# Patient Record
Sex: Male | Born: 1935 | Race: White | Hispanic: No | Marital: Married
Health system: Southern US, Community
[De-identification: ages and names within clinical notes are randomized; demographics above are authoritative.]

## PROBLEM LIST (undated history)

## (undated) DIAGNOSIS — I10 Essential (primary) hypertension: Secondary | ICD-10-CM

---

## 2016-06-02 ENCOUNTER — Emergency Department (HOSPITAL_COMMUNITY): Payer: Medicare Other

## 2016-06-02 ENCOUNTER — Encounter (HOSPITAL_COMMUNITY): Payer: Self-pay

## 2016-06-02 ENCOUNTER — Emergency Department (HOSPITAL_COMMUNITY)
Admission: EM | Admit: 2016-06-02 | Discharge: 2016-06-02 | Disposition: A | Discharge status: Home / Self Care | Payer: Medicare Other | Attending: Emergency Medicine | Admitting: Emergency Medicine

## 2016-06-02 DIAGNOSIS — R52 Pain, unspecified: Secondary | ICD-10-CM

## 2016-06-02 DIAGNOSIS — M65332 Trigger finger, left middle finger: Secondary | ICD-10-CM | POA: Insufficient documentation

## 2016-06-02 DIAGNOSIS — I10 Essential (primary) hypertension: Secondary | ICD-10-CM | POA: Insufficient documentation

## 2016-06-02 DIAGNOSIS — M79642 Pain in left hand: Secondary | ICD-10-CM | POA: Diagnosis present

## 2016-06-02 HISTORY — DX: Essential (primary) hypertension: I10

## 2016-06-02 NOTE — ED Provider Notes (Signed)
[facility]-EMERGENCY DEPT Provider Note   CSN: [id number] Arrival date & time: 06/02/16  1006     History   Chief Complaint Chief Complaint  Patient presents with  . Hand Pain    HPI Antonio Campbell is a 82 y.o. 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 He states it feels the same as when he had trigger finger on his right hand. He has noticed that it would lock up intermittently but he was still able to straighten it out until this morning when he got stuck in flexion. He describes his pain as 10/10 intermittent with movement, sharp nonradiating left middle finger pain worse with movement and unimproved with warmth. He has not tried anything else for his symptoms. He has not been able to "unlock" his finger from a flexed position. His last surgery for trigger finger on the right side was in 2007 by Dr. Allene Dillon in [place]. He denies warmth, erythema, bruising, swelling, numbness, tingling, focal weakness, or any other complaints at this time. Denies recent injury.   The history is provided by the patient and medical records. No language interpreter was used.  Hand Pain  This is a recurrent problem. The current episode started more than 1 week ago. Episode frequency: intermittent. The problem has not changed since onset.Exacerbated by: movement. Nothing relieves the symptoms. He has tried a warm compress for the symptoms. The treatment provided no relief.    Past Medical History:  Diagnosis Date  . Hypertension     There are no active problems to display for this patient.   No past surgical history on file.     Home Medications    Prior to Admission medications   Not on File    Family History No family history on file.  Social History Social History  Substance Use Topics  . Smoking status: Not on file  .  Smokeless tobacco: Not on file  . Alcohol use Not on file     Allergies   Bystolic [nebivolol hcl]; Cardizem [diltiazem]; Cephalosporins; Cyclobenzaprine; Cymbalta [duloxetine hcl]; Dilantin [phenytoin sodium extended]; Neurontin [gabapentin]; Penicillins; and Sulfa antibiotics   Review of Systems Review of Systems  Musculoskeletal: Positive for arthralgias. Negative for joint swelling.  Skin: Negative for color change and wound.  Allergic/Immunologic: Negative for immunocompromised state.  Neurological: Negative for weakness and numbness.  Psychiatric/Behavioral: Negative for confusion.   10 Systems reviewed and are negative for acute change except as noted in the HPI.   Physical Exam Updated Vital Signs BP 152/85 (BP Location: Left Arm)   Pulse 75   Temp 98.6 F (37 C) (Oral)   Resp 17   Ht 5\' 9"  (1.753 m)   Wt 88.5 kg   SpO2 96%   BMI 28.80 kg/m   Physical Exam  Constitutional: He is oriented to person, place, and time. Vital signs are normal. He appears well-developed and well-nourished.  Non-toxic appearance. No distress.  Afebrile, nontoxic, NAD  HENT:  Head: Normocephalic and atraumatic.  Mouth/Throat: Mucous membranes are normal.  Eyes: Conjunctivae and EOM are normal. Right eye exhibits no discharge. Left eye exhibits no discharge.  Neck: Normal range of motion. Neck supple.  Cardiovascular: Normal rate and intact distal pulses.   Pulmonary/Chest: Effort normal. No respiratory distress.  Abdominal: Normal appearance. He exhibits no distension.  Musculoskeletal:  Left hand: He exhibits decreased range of motion (initially of middle finger) and tenderness. He exhibits no bony tenderness, normal two-point discrimination, normal capillary refill, no deformity, no laceration and no swelling. Normal sensation noted. Normal strength noted.       Hands: L middle finger initially locked in flexion at DIP and PIP joints, with mild TTP over palmar aspect of 3rd MCP  joint area, but after gentle massage of this area, middle finger was able to be extended fully and audible snap heard and palpated at the MCP area; after finger was fully extended, pt able to demonstrate FROM in all joints; wiggles all fingers with ease. No focal bony TTP. No crepitus or deformity, no swelling, no erythema or bruising, no warmth. Strength and sensation grossly intact, distal pulses intact, compartments soft. L wrist unremarkable.   Neurological: He is alert and oriented to person, place, and time. He has normal strength. No sensory deficit.  Skin: Skin is warm, dry and intact. No rash noted.  Psychiatric: He has a normal mood and affect.  Nursing note and vitals reviewed.    ED Treatments / Results  Labs (all labs ordered are listed, but only abnormal results are displayed) Labs Reviewed - No data to display  EKG  EKG Interpretation None       Radiology Dg Finger Middle Left  Result Date: 06/02/2016 CLINICAL DATA:  Flexion deformity of the third digit. EXAM: LEFT MIDDLE FINGER 2+V COMPARISON:  None. FINDINGS: Flexion at the PIP and DIP joints of the long finger. The joint spaces are fairly well maintained for age. Mild osteoarthritic type degenerative changes involving the DIP joints. No fracture or dislocation is identified. IMPRESSION: Flexion at the DIP and PIP joints of the third finger but no fracture or dislocation. Electronically Signed   By: Rudie MeyerP.  Gallerani M.D.   On: 06/02/2016 11:38    Procedures Procedures (including critical care time)  Medications Ordered in ED Medications - No data to display   Initial Impression / Assessment and Plan / ED Course  I have reviewed the triage vital signs and the nursing notes.  Pertinent labs & imaging results that were available during my care of the patient were reviewed by me and considered in my medical decision making (see chart for details).     82 y.o. male here with trigger finger of L middle finger. Finger  initially locked in place in flexion, but after massage of flexor tendons, I was able to full extend the finger again; splinted into place. Discussed NSAIDs for antiinflammatory properties. Heat/ice/tylenol use as well. F/up with his orthopedist in 1-2wks for ongoing management of his trigger finger. Of note, xray obtained in triage which was unremarkable aside from demonstrating his flexion at DIP and PIP joints. I explained the diagnosis and have given explicit precautions to return to the ER including for any other new or worsening symptoms. The patient understands and accepts the medical plan as it's been dictated and I have answered their questions. Discharge instructions concerning home care and prescriptions have been given. The patient is STABLE and is discharged to home in good condition.   Final Clinical Impressions(s) / ED Diagnoses   Final diagnoses:  Trigger middle finger of left hand    New Prescriptions New Prescriptions   No medications on file     [id number] LaneMercedes Ruthanne Mcneish, PA-C 06/02/16 1200    Cathren LaineKevin Steinl, MD 06/02/16 [id number]

## 2016-06-02 NOTE — ED Triage Notes (Signed)
Patient here with left hand middle finger pain x 2 weeks, has had limited ROM to finger and this am unable to extend finger without severe pain. No known injury, NAD

## 2016-06-02 NOTE — Discharge Instructions (Signed)
You symptoms are due to trigger finger. Use motrin/aleve/NSAIDs to help with inflammation and pain, using additional tylenol for additional pain relief. Use ice or heat to the area to help with pain and inflammation. Massage the area to help with it getting locked in place. Use the splint to help prevent the finger from getting locked in place again. Follow up with your orthopedist in 1-2 weeks for ongoing management of your trigger finger. Return to the ER for changes or worsening symptoms.

## 2016-06-09 ENCOUNTER — Encounter (HOSPITAL_COMMUNITY): Payer: Self-pay | Admitting: Emergency Medicine

## 2016-06-09 ENCOUNTER — Ambulatory Visit (HOSPITAL_COMMUNITY)
Admission: EM | Admit: 2016-06-09 | Discharge: 2016-06-09 | Disposition: A | Discharge status: Home / Self Care | Payer: Medicare Other | Attending: Internal Medicine | Admitting: Internal Medicine

## 2016-06-09 DIAGNOSIS — J111 Influenza due to unidentified influenza virus with other respiratory manifestations: Secondary | ICD-10-CM

## 2016-06-09 DIAGNOSIS — R69 Illness, unspecified: Secondary | ICD-10-CM

## 2016-06-09 MED ORDER — GUAIFENESIN-CODEINE 100-10 MG/5ML PO SYRP: 5.0000 mL | ORAL_SOLUTION | ORAL | 0 refills | Status: AC | PRN

## 2016-06-09 MED ORDER — OSELTAMIVIR PHOSPHATE 75 MG PO CAPS: 75.0000 mg | ORAL_CAPSULE | Freq: Two times a day (BID) | ORAL | 0 refills | Status: AC

## 2016-06-09 NOTE — ED Triage Notes (Signed)
The patient presented to the [facility] with a complaint of a cough and body aches x 3 days. 

## 2016-06-09 NOTE — Discharge Instructions (Signed)
Likely all symptoms considered you have flulike symptoms if not influenza itself. We will go ahead and treat with Tamiflu. The following medications are listed to help with specific symptoms associated with upper respiratory infection and flulike symptoms. The medicine to help with drainage would be antihistamines. He will see that listed below. I do not think any of these medicines are on your allergy list however recheck if needed. Be sure to drink plenty fluids and stay well-hydrated, cool liquids are best for sore throat. Cepacol lozenges for sore throat pain. Ibuprofen or Tylenol for aches and pains and fever. Sudafed PE 10 mg every 4 to 6 hours as needed for congestion Allegra or Zyrtec daily as needed for drainage and runny nose. For stronger antihistamine may take Chlor-Trimeton 2 to 4 mg every 4 to 6 hours, may cause drowsiness. Saline nasal spray used frequently. Ibuprofen 600 mg every 6 hours as needed for pain, discomfort or fever. Drink plenty of fluids and stay well-hydrated.

## 2016-06-09 NOTE — ED Provider Notes (Signed)
CSN: [id number]     Arrival date & time 06/09/16  1609 History   First MD Initiated Contact with Patient 06/09/16 1911     Chief Complaint  Patient presents with  . Cough   (Consider location/radiation/quality/duration/timing/severity/associated sxs/prior Treatment) 82 year old male presents to the urgent care with a several upper respiratory symptoms including cough, runny nose, PND, body aches and pains, sore throat, fever and headache. He states his temperature was 100.1 recently and on arrival to the urgent care 9.5. He states he had a flu shot this year. And states that he feels like he probably has the flu now. The symptoms began rather suddenly yesterday.      Past Medical History:  Diagnosis Date  . Hypertension    History reviewed. No pertinent surgical history. History reviewed. No pertinent family history. Social History  Substance Use Topics  . Smoking status: Not on file  . Smokeless tobacco: Not on file  . Alcohol use Not on file    Review of Systems  Constitutional: Positive for activity change, fatigue and fever. Negative for diaphoresis.  HENT: Positive for congestion, postnasal drip, rhinorrhea and sore throat. Negative for ear pain, facial swelling and trouble swallowing.   Eyes: Negative for pain, discharge and redness.  Respiratory: Positive for cough. Negative for chest tightness and shortness of breath.   Cardiovascular: Negative.   Gastrointestinal: Negative.   Musculoskeletal: Positive for myalgias. Negative for neck pain and neck stiffness.  Neurological: Negative.   All other systems reviewed and are negative.   Allergies  Bystolic [nebivolol hcl]; Cardizem [diltiazem]; Cephalosporins; Cyclobenzaprine; Cymbalta [duloxetine hcl]; Dilantin [phenytoin sodium extended]; Neurontin [gabapentin]; Penicillins; and Sulfa antibiotics  Home Medications   Prior to Admission medications   Medication Sig Start Date End Date Taking? Authorizing Provider   aspirin EC 81 MG tablet Take 81 mg by mouth daily.    Historical Provider, MD  citalopram (CELEXA) 10 MG tablet Take 10 mg by mouth 2 (two) times daily.    Historical Provider, MD  hydrALAZINE (APRESOLINE) 50 MG tablet Take 50 mg by mouth 2 (two) times daily. 03/30/16   Historical Provider, MD  hydrocortisone 2.5 % cream Apply 1 application topically 2 (two) times daily.    Historical Provider, MD  lisinopril (PRINIVIL,ZESTRIL) 5 MG tablet Take 5 mg by mouth 2 (two) times daily.    Historical Provider, MD  magnesium oxide (MAG-OX) 400 MG tablet Take 400 mg by mouth 2 (two) times daily.    Historical Provider, MD  meloxicam (MOBIC) 15 MG tablet Take 15 mg by mouth daily.    Historical Provider, MD  Multiple Vitamins-Minerals (MULTIVITAMIN PO) Take 1 tablet by mouth daily.    Historical Provider, MD  omeprazole (PRILOSEC) 20 MG capsule Take 20 mg by mouth daily.    Historical Provider, MD  oseltamivir (TAMIFLU) 75 MG capsule Take 1 capsule (75 mg total) by mouth 2 (two) times daily. X 5 days 06/09/16   Hayden Rasmussen, NP  Potassium Gluconate 595 MG CAPS Take 595 mg by mouth 2 (two) times daily.    Historical Provider, MD  zolpidem (AMBIEN) 5 MG tablet Take 5 mg by mouth at bedtime.    Historical Provider, MD   Meds Ordered and Administered this Visit  Medications - No data to display  BP 146/57 (BP Location: Right Arm)   Pulse 85   Temp 99.5 F (37.5 C) (Oral)   Resp 20   SpO2 98%  No data found.   Physical Exam  Constitutional:  He is oriented to person, place, and time. He appears well-developed and well-nourished. No distress.  HENT:  Head: Normocephalic and atraumatic.  Right Ear: External ear normal.  Left Ear: External ear normal.  Bilateral TMs are normal. Oropharynx with minor erythema and clear PND. No exudates  Eyes: EOM are normal.  Neck: Normal range of motion. Neck supple. No JVD present.  Cardiovascular: Normal rate, regular rhythm, normal heart sounds and intact distal  pulses.   Pulmonary/Chest: Effort normal and breath sounds normal. No respiratory distress. He has no wheezes. He has no rales.  Lungs perfectly clear. No adventitious sounds. Good air movement chest expansion.  Abdominal: Soft. There is no tenderness.  Musculoskeletal: Normal range of motion. He exhibits no edema.  Neurological: He is alert and oriented to person, place, and time.  Skin: Skin is warm and dry.  Psychiatric: He has a normal mood and affect.  Nursing note and vitals reviewed.   Urgent Care Course     Procedures (including critical care time)  Labs Review Labs Reviewed - No data to display  Imaging Review No results found.   Visual Acuity Review  Right Eye Distance:   Left Eye Distance:   Bilateral Distance:    Right Eye Near:   Left Eye Near:    Bilateral Near:         MDM   1. Influenza-like illness    Likely all symptoms considered you have flulike symptoms if not influenza itself. We will go ahead and treat with Tamiflu. The following medications are listed to help with specific symptoms associated with upper respiratory infection and flulike symptoms. The medicine to help with drainage would be antihistamines. He will see that listed below. I do not think any of these medicines are on your allergy list however recheck if needed. Be sure to drink plenty fluids and stay well-hydrated, cool liquids are best for sore throat. Cepacol lozenges for sore throat pain. Ibuprofen or Tylenol for aches and pains and fever. Sudafed PE 10 mg every 4 to 6 hours as needed for congestion Allegra or Zyrtec daily as needed for drainage and runny nose. For stronger antihistamine may take Chlor-Trimeton 2 to 4 mg every 4 to 6 hours, may cause drowsiness. Saline nasal spray used frequently. Ibuprofen 600 mg every 6 hours as needed for pain, discomfort or fever. Drink plenty of fluids and stay well-hydrated. Meds ordered this encounter  Medications  . oseltamivir  (TAMIFLU) 75 MG capsule    Sig: Take 1 capsule (75 mg total) by mouth 2 (two) times daily. X 5 days    Dispense:  10 capsule    Refill:  0    Order Specific Question:   Supervising Provider    Answer:   Eustace MooreMURRAY, LAURA W [[id number]]   Cheratussin AC 120 ml as dir.    Hayden Rasmussenavid Shiann Kam, NP 06/09/16 1932    Hayden Rasmussenavid Carmaleta Youngers, NP 06/09/16 1958

## 2017-11-08 IMAGING — DX DG FINGER MIDDLE 2+V*L*
4 series · 4 of 4 positions shown · non-contrast
Comparison: None.

CLINICAL DATA: Flexion deformity of the third digit.

EXAM:
LEFT MIDDLE FINGER 2+V

[finger ap]
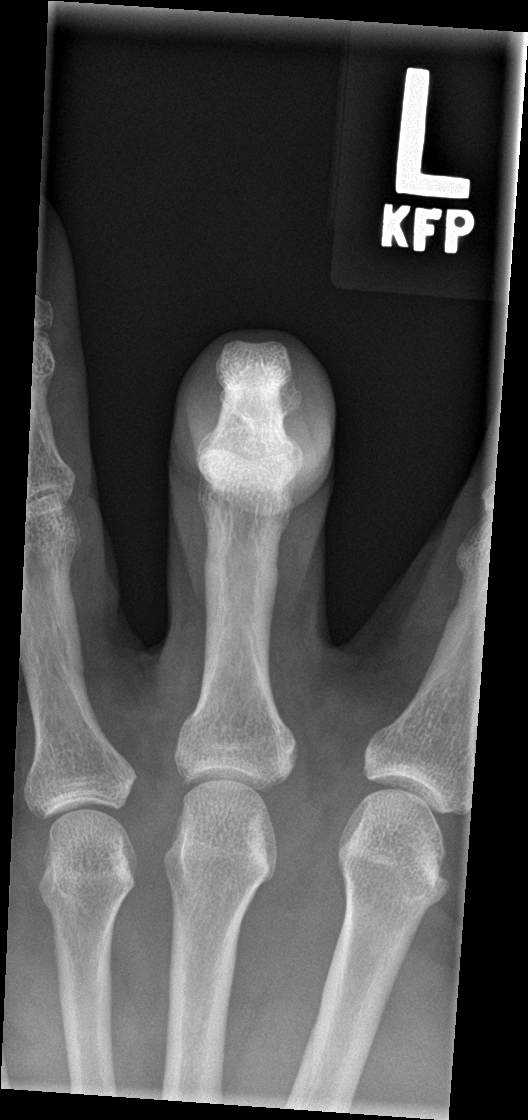

[finger obl]
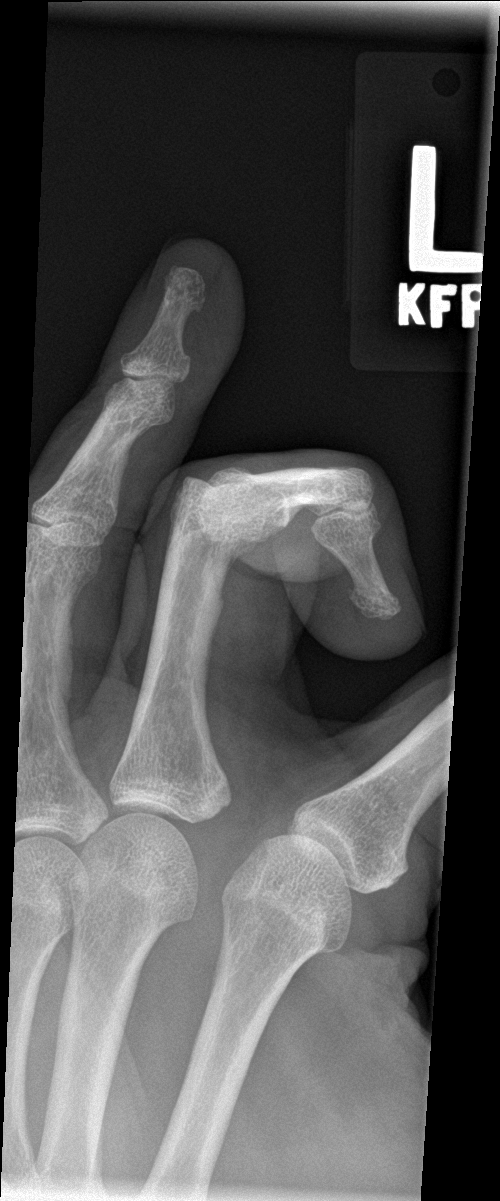

[finger lat (1 of 2)]
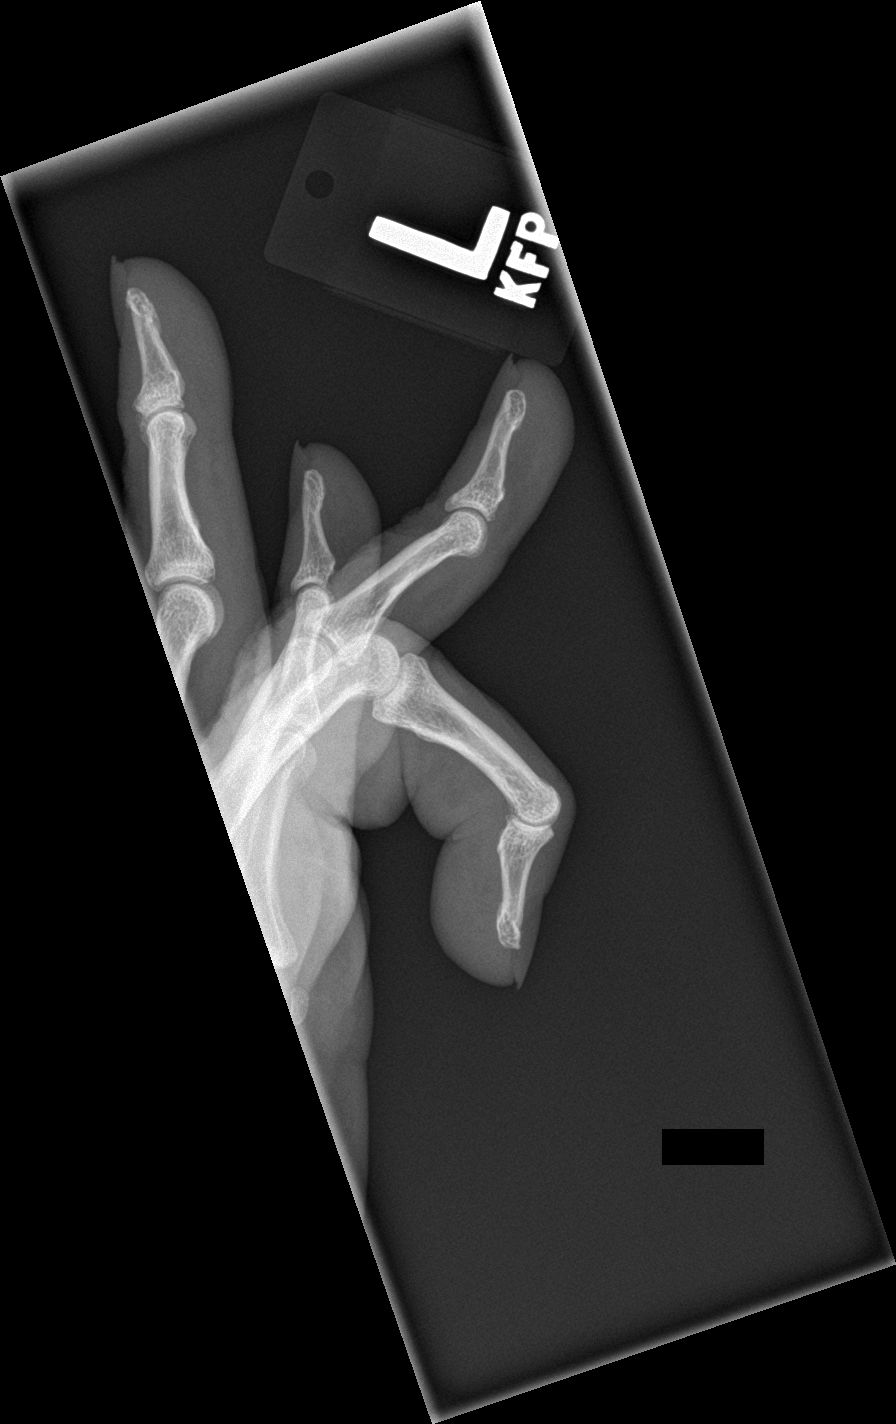

[finger lat (2 of 2)]
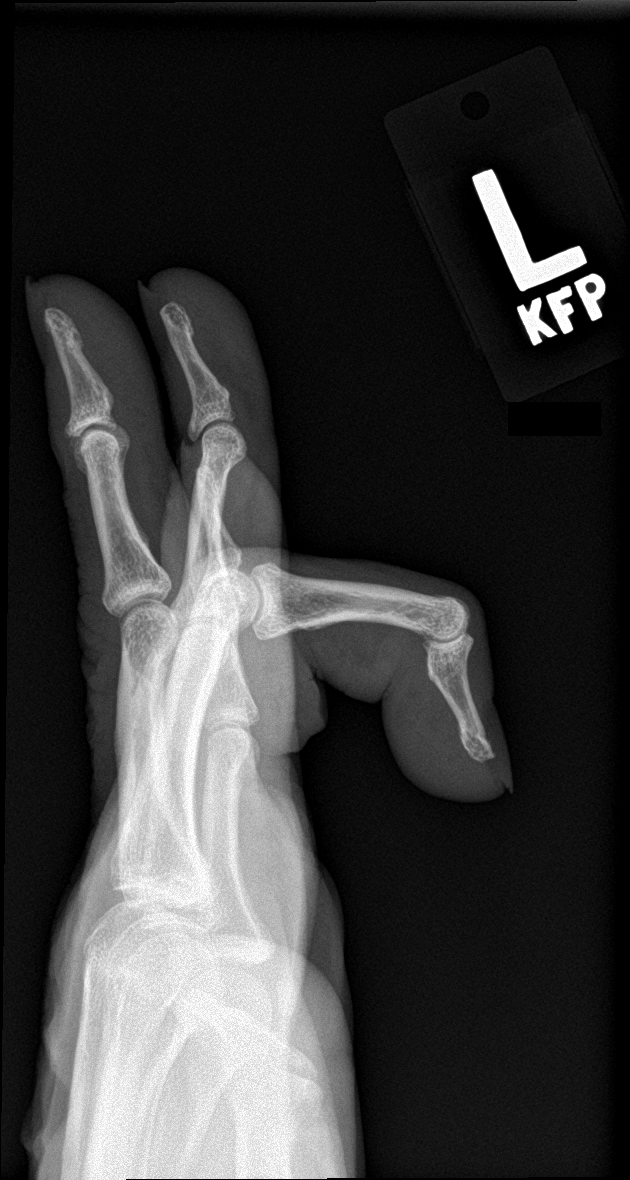

[4 of 4 positions shown; findings below may reference images not displayed]

FINDINGS: Flexion at the PIP and DIP joints of the long finger. The joint
spaces are fairly well maintained for age. Mild osteoarthritic type
degenerative changes involving the DIP joints. No fracture or
dislocation is identified.
IMPRESSION: Flexion at the DIP and PIP joints of the third finger but no
fracture or dislocation.
